# Patient Record
Sex: Female | Born: 1950 | Race: White | Hispanic: No | Marital: Married
Health system: Southern US, Community
[De-identification: ages and names within clinical notes are randomized; demographics above are authoritative.]

---

## 2003-11-08 ENCOUNTER — Other Ambulatory Visit: Admission: RE | Admit: 2003-11-08 | Discharge: 2003-11-08 | Payer: Self-pay | Admitting: Family Medicine

## 2005-04-07 ENCOUNTER — Other Ambulatory Visit: Admission: RE | Admit: 2005-04-07 | Discharge: 2005-04-07 | Payer: Self-pay | Admitting: Family Medicine

## 2008-03-08 ENCOUNTER — Ambulatory Visit: Payer: Self-pay | Admitting: Family Medicine

## 2008-03-08 DIAGNOSIS — M25539 Pain in unspecified wrist: Secondary | ICD-10-CM

## 2008-04-10 ENCOUNTER — Other Ambulatory Visit: Admission: RE | Admit: 2008-04-10 | Discharge: 2008-04-10 | Payer: Self-pay | Admitting: Family Medicine

## 2012-07-15 ENCOUNTER — Other Ambulatory Visit: Payer: Self-pay | Admitting: Family Medicine

## 2012-07-15 ENCOUNTER — Other Ambulatory Visit (HOSPITAL_COMMUNITY)
Admission: RE | Admit: 2012-07-15 | Discharge: 2012-07-15 | Disposition: A | Discharge status: Home / Self Care | Payer: 59 | Source: Ambulatory Visit | Attending: Family Medicine | Admitting: Family Medicine

## 2012-07-15 DIAGNOSIS — Z780 Asymptomatic menopausal state: Secondary | ICD-10-CM

## 2012-07-15 DIAGNOSIS — Z1151 Encounter for screening for human papillomavirus (HPV): Secondary | ICD-10-CM | POA: Insufficient documentation

## 2012-07-15 DIAGNOSIS — Z1231 Encounter for screening mammogram for malignant neoplasm of breast: Secondary | ICD-10-CM

## 2012-07-15 DIAGNOSIS — Z124 Encounter for screening for malignant neoplasm of cervix: Secondary | ICD-10-CM | POA: Insufficient documentation

## 2012-08-09 ENCOUNTER — Other Ambulatory Visit: Payer: Self-pay | Admitting: Family Medicine

## 2012-08-09 ENCOUNTER — Ambulatory Visit: Payer: Self-pay

## 2012-08-09 ENCOUNTER — Other Ambulatory Visit: Payer: Self-pay

## 2012-08-09 DIAGNOSIS — E2839 Other primary ovarian failure: Secondary | ICD-10-CM

## 2012-08-09 DIAGNOSIS — Z780 Asymptomatic menopausal state: Secondary | ICD-10-CM

## 2012-08-09 DIAGNOSIS — Z1231 Encounter for screening mammogram for malignant neoplasm of breast: Secondary | ICD-10-CM

## 2012-09-05 ENCOUNTER — Ambulatory Visit
Admission: RE | Admit: 2012-09-05 | Discharge: 2012-09-05 | Disposition: A | Discharge status: Home / Self Care | Payer: 59 | Source: Ambulatory Visit | Attending: Family Medicine | Admitting: Family Medicine

## 2012-09-05 DIAGNOSIS — E2839 Other primary ovarian failure: Secondary | ICD-10-CM

## 2012-09-05 DIAGNOSIS — Z780 Asymptomatic menopausal state: Secondary | ICD-10-CM

## 2014-05-07 ENCOUNTER — Telehealth: Payer: Self-pay | Admitting: Cardiology

## 2014-05-07 NOTE — Telephone Encounter (Signed)
Pt signed [facility] She faxed To Me She is asking For All Records, I have faxed to [facility] For her records to be processed  I have also Sent Nichole a e-mail Letting her Know about Release faxed. ROI Placed In [employer]  11.9.15/km

## 2015-07-04 DIAGNOSIS — H40013 Open angle with borderline findings, low risk, bilateral: Secondary | ICD-10-CM | POA: Diagnosis not present

## 2015-07-04 DIAGNOSIS — H5210 Myopia, unspecified eye: Secondary | ICD-10-CM | POA: Diagnosis not present

## 2015-07-04 DIAGNOSIS — H524 Presbyopia: Secondary | ICD-10-CM | POA: Diagnosis not present

## 2015-07-04 DIAGNOSIS — H5111 Convergence insufficiency: Secondary | ICD-10-CM | POA: Diagnosis not present

## 2015-07-17 DIAGNOSIS — M7121 Synovial cyst of popliteal space [Baker], right knee: Secondary | ICD-10-CM | POA: Diagnosis not present

## 2015-07-17 DIAGNOSIS — M25561 Pain in right knee: Secondary | ICD-10-CM | POA: Diagnosis not present

## 2015-07-22 DIAGNOSIS — M25561 Pain in right knee: Secondary | ICD-10-CM | POA: Diagnosis not present

## 2015-07-22 DIAGNOSIS — M7121 Synovial cyst of popliteal space [Baker], right knee: Secondary | ICD-10-CM | POA: Diagnosis not present

## 2015-07-25 DIAGNOSIS — M25561 Pain in right knee: Secondary | ICD-10-CM | POA: Diagnosis not present

## 2015-07-25 DIAGNOSIS — M7121 Synovial cyst of popliteal space [Baker], right knee: Secondary | ICD-10-CM | POA: Diagnosis not present

## 2015-07-29 DIAGNOSIS — M7121 Synovial cyst of popliteal space [Baker], right knee: Secondary | ICD-10-CM | POA: Diagnosis not present

## 2015-07-29 DIAGNOSIS — M25561 Pain in right knee: Secondary | ICD-10-CM | POA: Diagnosis not present

## 2015-08-01 DIAGNOSIS — M7121 Synovial cyst of popliteal space [Baker], right knee: Secondary | ICD-10-CM | POA: Diagnosis not present

## 2015-08-01 DIAGNOSIS — M25561 Pain in right knee: Secondary | ICD-10-CM | POA: Diagnosis not present

## 2015-08-05 DIAGNOSIS — M25561 Pain in right knee: Secondary | ICD-10-CM | POA: Diagnosis not present

## 2015-08-05 DIAGNOSIS — M7121 Synovial cyst of popliteal space [Baker], right knee: Secondary | ICD-10-CM | POA: Diagnosis not present

## 2015-08-08 DIAGNOSIS — M7121 Synovial cyst of popliteal space [Baker], right knee: Secondary | ICD-10-CM | POA: Diagnosis not present

## 2015-08-08 DIAGNOSIS — M25561 Pain in right knee: Secondary | ICD-10-CM | POA: Diagnosis not present

## 2015-08-12 DIAGNOSIS — M25561 Pain in right knee: Secondary | ICD-10-CM | POA: Diagnosis not present

## 2015-08-12 DIAGNOSIS — M7121 Synovial cyst of popliteal space [Baker], right knee: Secondary | ICD-10-CM | POA: Diagnosis not present

## 2015-08-15 DIAGNOSIS — M25561 Pain in right knee: Secondary | ICD-10-CM | POA: Diagnosis not present

## 2015-08-15 DIAGNOSIS — M7121 Synovial cyst of popliteal space [Baker], right knee: Secondary | ICD-10-CM | POA: Diagnosis not present

## 2015-12-18 ENCOUNTER — Other Ambulatory Visit: Payer: Self-pay | Admitting: Family Medicine

## 2015-12-18 ENCOUNTER — Other Ambulatory Visit (HOSPITAL_COMMUNITY)
Admission: RE | Admit: 2015-12-18 | Discharge: 2015-12-18 | Disposition: A | Discharge status: Home / Self Care | Payer: Commercial Managed Care - HMO | Source: Ambulatory Visit | Attending: Family Medicine | Admitting: Family Medicine

## 2015-12-18 DIAGNOSIS — Z7189 Other specified counseling: Secondary | ICD-10-CM | POA: Diagnosis not present

## 2015-12-18 DIAGNOSIS — I868 Varicose veins of other specified sites: Secondary | ICD-10-CM | POA: Diagnosis not present

## 2015-12-18 DIAGNOSIS — Z1389 Encounter for screening for other disorder: Secondary | ICD-10-CM | POA: Diagnosis not present

## 2015-12-18 DIAGNOSIS — R252 Cramp and spasm: Secondary | ICD-10-CM | POA: Diagnosis not present

## 2015-12-18 DIAGNOSIS — Z124 Encounter for screening for malignant neoplasm of cervix: Secondary | ICD-10-CM | POA: Diagnosis not present

## 2015-12-18 DIAGNOSIS — Z0000 Encounter for general adult medical examination without abnormal findings: Secondary | ICD-10-CM | POA: Diagnosis not present

## 2015-12-18 DIAGNOSIS — Z136 Encounter for screening for cardiovascular disorders: Secondary | ICD-10-CM | POA: Diagnosis not present

## 2015-12-18 DIAGNOSIS — Z1211 Encounter for screening for malignant neoplasm of colon: Secondary | ICD-10-CM | POA: Diagnosis not present

## 2015-12-18 DIAGNOSIS — Z131 Encounter for screening for diabetes mellitus: Secondary | ICD-10-CM | POA: Diagnosis not present

## 2015-12-18 DIAGNOSIS — Z01419 Encounter for gynecological examination (general) (routine) without abnormal findings: Secondary | ICD-10-CM | POA: Diagnosis not present

## 2015-12-18 DIAGNOSIS — Z23 Encounter for immunization: Secondary | ICD-10-CM | POA: Diagnosis not present

## 2015-12-18 DIAGNOSIS — E559 Vitamin D deficiency, unspecified: Secondary | ICD-10-CM | POA: Diagnosis not present

## 2015-12-20 LAB — CYTOLOGY - PAP

## 2015-12-30 ENCOUNTER — Other Ambulatory Visit: Payer: Self-pay | Admitting: Family Medicine

## 2015-12-30 DIAGNOSIS — Z1231 Encounter for screening mammogram for malignant neoplasm of breast: Secondary | ICD-10-CM

## 2016-03-05 ENCOUNTER — Ambulatory Visit
Admission: RE | Admit: 2016-03-05 | Discharge: 2016-03-05 | Disposition: A | Discharge status: Home / Self Care | Payer: Commercial Managed Care - HMO | Source: Ambulatory Visit | Attending: Family Medicine | Admitting: Family Medicine

## 2016-03-05 ENCOUNTER — Encounter: Payer: Self-pay | Admitting: Radiology

## 2016-03-05 DIAGNOSIS — Z1231 Encounter for screening mammogram for malignant neoplasm of breast: Secondary | ICD-10-CM

## 2016-04-07 DIAGNOSIS — D126 Benign neoplasm of colon, unspecified: Secondary | ICD-10-CM | POA: Diagnosis not present

## 2016-04-07 DIAGNOSIS — D125 Benign neoplasm of sigmoid colon: Secondary | ICD-10-CM | POA: Diagnosis not present

## 2016-04-07 DIAGNOSIS — D120 Benign neoplasm of cecum: Secondary | ICD-10-CM | POA: Diagnosis not present

## 2016-04-07 DIAGNOSIS — K644 Residual hemorrhoidal skin tags: Secondary | ICD-10-CM | POA: Diagnosis not present

## 2016-04-07 DIAGNOSIS — K648 Other hemorrhoids: Secondary | ICD-10-CM | POA: Diagnosis not present

## 2016-04-07 DIAGNOSIS — D123 Benign neoplasm of transverse colon: Secondary | ICD-10-CM | POA: Diagnosis not present

## 2016-04-07 DIAGNOSIS — K635 Polyp of colon: Secondary | ICD-10-CM | POA: Diagnosis not present

## 2016-04-07 DIAGNOSIS — D124 Benign neoplasm of descending colon: Secondary | ICD-10-CM | POA: Diagnosis not present

## 2016-04-07 DIAGNOSIS — Z1211 Encounter for screening for malignant neoplasm of colon: Secondary | ICD-10-CM | POA: Diagnosis not present

## 2016-04-14 DIAGNOSIS — Z1211 Encounter for screening for malignant neoplasm of colon: Secondary | ICD-10-CM | POA: Diagnosis not present

## 2016-04-14 DIAGNOSIS — D126 Benign neoplasm of colon, unspecified: Secondary | ICD-10-CM | POA: Diagnosis not present

## 2016-04-14 DIAGNOSIS — K635 Polyp of colon: Secondary | ICD-10-CM | POA: Diagnosis not present

## 2016-12-15 DIAGNOSIS — I8312 Varicose veins of left lower extremity with inflammation: Secondary | ICD-10-CM | POA: Diagnosis not present

## 2016-12-15 DIAGNOSIS — I83893 Varicose veins of bilateral lower extremities with other complications: Secondary | ICD-10-CM | POA: Diagnosis not present

## 2016-12-15 DIAGNOSIS — I8311 Varicose veins of right lower extremity with inflammation: Secondary | ICD-10-CM | POA: Diagnosis not present

## 2016-12-24 DIAGNOSIS — E559 Vitamin D deficiency, unspecified: Secondary | ICD-10-CM | POA: Diagnosis not present

## 2016-12-24 DIAGNOSIS — Z0000 Encounter for general adult medical examination without abnormal findings: Secondary | ICD-10-CM | POA: Diagnosis not present

## 2016-12-24 DIAGNOSIS — Z131 Encounter for screening for diabetes mellitus: Secondary | ICD-10-CM | POA: Diagnosis not present

## 2016-12-24 DIAGNOSIS — Z1389 Encounter for screening for other disorder: Secondary | ICD-10-CM | POA: Diagnosis not present

## 2016-12-24 DIAGNOSIS — J309 Allergic rhinitis, unspecified: Secondary | ICD-10-CM | POA: Diagnosis not present

## 2016-12-24 DIAGNOSIS — Z86010 Personal history of colonic polyps: Secondary | ICD-10-CM | POA: Diagnosis not present

## 2016-12-24 DIAGNOSIS — Z7189 Other specified counseling: Secondary | ICD-10-CM | POA: Diagnosis not present

## 2016-12-24 DIAGNOSIS — Z23 Encounter for immunization: Secondary | ICD-10-CM | POA: Diagnosis not present

## 2016-12-24 DIAGNOSIS — Z8659 Personal history of other mental and behavioral disorders: Secondary | ICD-10-CM | POA: Diagnosis not present

## 2016-12-24 DIAGNOSIS — Z1159 Encounter for screening for other viral diseases: Secondary | ICD-10-CM | POA: Diagnosis not present

## 2017-12-28 DIAGNOSIS — Z79899 Other long term (current) drug therapy: Secondary | ICD-10-CM | POA: Diagnosis not present

## 2017-12-28 DIAGNOSIS — Z131 Encounter for screening for diabetes mellitus: Secondary | ICD-10-CM | POA: Diagnosis not present

## 2017-12-28 DIAGNOSIS — R937 Abnormal findings on diagnostic imaging of other parts of musculoskeletal system: Secondary | ICD-10-CM | POA: Diagnosis not present

## 2017-12-28 DIAGNOSIS — D126 Benign neoplasm of colon, unspecified: Secondary | ICD-10-CM | POA: Diagnosis not present

## 2017-12-28 DIAGNOSIS — Z0000 Encounter for general adult medical examination without abnormal findings: Secondary | ICD-10-CM | POA: Diagnosis not present

## 2017-12-28 DIAGNOSIS — E559 Vitamin D deficiency, unspecified: Secondary | ICD-10-CM | POA: Diagnosis not present

## 2018-01-11 ENCOUNTER — Other Ambulatory Visit: Payer: Self-pay | Admitting: Family Medicine

## 2018-01-11 DIAGNOSIS — R5381 Other malaise: Secondary | ICD-10-CM

## 2018-01-14 ENCOUNTER — Other Ambulatory Visit: Payer: Self-pay | Admitting: Family Medicine

## 2018-01-14 DIAGNOSIS — E2839 Other primary ovarian failure: Secondary | ICD-10-CM

## 2018-04-15 DIAGNOSIS — K648 Other hemorrhoids: Secondary | ICD-10-CM | POA: Diagnosis not present

## 2018-04-15 DIAGNOSIS — Z86010 Personal history of colonic polyps: Secondary | ICD-10-CM | POA: Diagnosis not present

## 2018-04-15 DIAGNOSIS — D125 Benign neoplasm of sigmoid colon: Secondary | ICD-10-CM | POA: Diagnosis not present

## 2018-04-15 DIAGNOSIS — D123 Benign neoplasm of transverse colon: Secondary | ICD-10-CM | POA: Diagnosis not present

## 2018-04-15 DIAGNOSIS — D120 Benign neoplasm of cecum: Secondary | ICD-10-CM | POA: Diagnosis not present

## 2018-04-15 DIAGNOSIS — D124 Benign neoplasm of descending colon: Secondary | ICD-10-CM | POA: Diagnosis not present

## 2018-04-19 DIAGNOSIS — D125 Benign neoplasm of sigmoid colon: Secondary | ICD-10-CM | POA: Diagnosis not present

## 2018-04-19 DIAGNOSIS — D123 Benign neoplasm of transverse colon: Secondary | ICD-10-CM | POA: Diagnosis not present

## 2018-04-19 DIAGNOSIS — D124 Benign neoplasm of descending colon: Secondary | ICD-10-CM | POA: Diagnosis not present

## 2018-04-19 DIAGNOSIS — D120 Benign neoplasm of cecum: Secondary | ICD-10-CM | POA: Diagnosis not present

## 2019-01-23 DIAGNOSIS — D126 Benign neoplasm of colon, unspecified: Secondary | ICD-10-CM | POA: Diagnosis not present

## 2019-01-23 DIAGNOSIS — Z131 Encounter for screening for diabetes mellitus: Secondary | ICD-10-CM | POA: Diagnosis not present

## 2019-01-23 DIAGNOSIS — Z79899 Other long term (current) drug therapy: Secondary | ICD-10-CM | POA: Diagnosis not present

## 2019-01-23 DIAGNOSIS — R937 Abnormal findings on diagnostic imaging of other parts of musculoskeletal system: Secondary | ICD-10-CM | POA: Diagnosis not present

## 2019-01-23 DIAGNOSIS — Z0000 Encounter for general adult medical examination without abnormal findings: Secondary | ICD-10-CM | POA: Diagnosis not present

## 2019-01-23 DIAGNOSIS — E559 Vitamin D deficiency, unspecified: Secondary | ICD-10-CM | POA: Diagnosis not present

## 2019-01-24 DIAGNOSIS — M1990 Unspecified osteoarthritis, unspecified site: Secondary | ICD-10-CM | POA: Diagnosis not present

## 2019-01-24 DIAGNOSIS — Z86010 Personal history of colonic polyps: Secondary | ICD-10-CM | POA: Diagnosis not present

## 2019-01-24 DIAGNOSIS — Z131 Encounter for screening for diabetes mellitus: Secondary | ICD-10-CM | POA: Diagnosis not present

## 2019-01-24 DIAGNOSIS — E559 Vitamin D deficiency, unspecified: Secondary | ICD-10-CM | POA: Diagnosis not present

## 2019-01-24 DIAGNOSIS — Z8659 Personal history of other mental and behavioral disorders: Secondary | ICD-10-CM | POA: Diagnosis not present

## 2019-01-24 DIAGNOSIS — J309 Allergic rhinitis, unspecified: Secondary | ICD-10-CM | POA: Diagnosis not present

## 2019-01-24 DIAGNOSIS — Z13220 Encounter for screening for lipoid disorders: Secondary | ICD-10-CM | POA: Diagnosis not present

## 2019-01-26 DIAGNOSIS — Z0000 Encounter for general adult medical examination without abnormal findings: Secondary | ICD-10-CM | POA: Diagnosis not present

## 2019-03-20 DIAGNOSIS — N950 Postmenopausal bleeding: Secondary | ICD-10-CM | POA: Diagnosis not present

## 2019-03-23 ENCOUNTER — Other Ambulatory Visit: Payer: Self-pay | Admitting: Family Medicine

## 2019-03-23 DIAGNOSIS — N950 Postmenopausal bleeding: Secondary | ICD-10-CM

## 2019-04-03 ENCOUNTER — Ambulatory Visit
Admission: RE | Admit: 2019-04-03 | Discharge: 2019-04-03 | Disposition: A | Discharge status: Home / Self Care | Payer: Commercial Managed Care - HMO | Source: Ambulatory Visit | Attending: Family Medicine | Admitting: Family Medicine

## 2019-04-03 DIAGNOSIS — N950 Postmenopausal bleeding: Secondary | ICD-10-CM | POA: Diagnosis not present

## 2019-04-03 DIAGNOSIS — N858 Other specified noninflammatory disorders of uterus: Secondary | ICD-10-CM | POA: Diagnosis not present

## 2019-05-30 DIAGNOSIS — E559 Vitamin D deficiency, unspecified: Secondary | ICD-10-CM | POA: Diagnosis not present

## 2019-05-30 DIAGNOSIS — N950 Postmenopausal bleeding: Secondary | ICD-10-CM | POA: Diagnosis not present

## 2019-05-30 DIAGNOSIS — G5701 Lesion of sciatic nerve, right lower limb: Secondary | ICD-10-CM | POA: Diagnosis not present

## 2019-05-30 DIAGNOSIS — Z23 Encounter for immunization: Secondary | ICD-10-CM | POA: Diagnosis not present

## 2019-09-21 DIAGNOSIS — H5203 Hypermetropia, bilateral: Secondary | ICD-10-CM | POA: Diagnosis not present

## 2019-10-17 ENCOUNTER — Other Ambulatory Visit: Payer: Self-pay | Admitting: Family Medicine

## 2019-10-17 DIAGNOSIS — Z1231 Encounter for screening mammogram for malignant neoplasm of breast: Secondary | ICD-10-CM

## 2020-01-05 IMAGING — US US PELVIS COMPLETE WITH TRANSVAGINAL
1 series · 13 of 25 positions shown · non-contrast
Comparison: None

CLINICAL DATA: Postmenopausal bleeding for 11 days

EXAM:
TRANSABDOMINAL AND TRANSVAGINAL ULTRASOUND OF PELVIS
TECHNIQUE: Both transabdominal and transvaginal ultrasound examinations of the
pelvis were performed. Transabdominal technique was performed for
global imaging of the pelvis including uterus, ovaries, adnexal
regions, and pelvic cul-de-sac. It was necessary to proceed with
endovaginal exam following the transabdominal exam to visualize the
endometrium and ovaries.

[Series 1: us pelvis complete with transvaginal · 0.28mm/px · 13 of 39 slices shown]
[im 1/39]
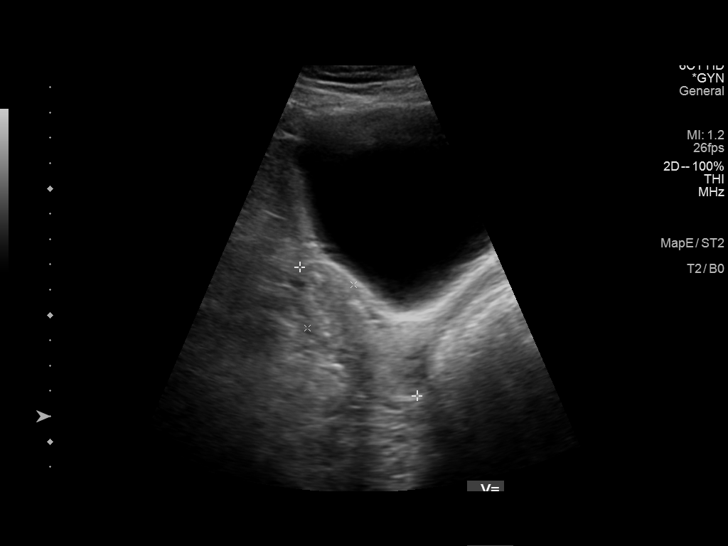
[im 4/39]
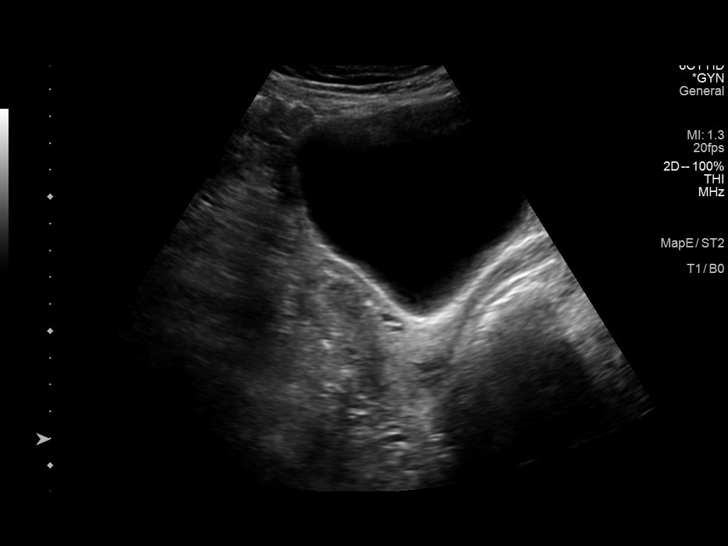
[im 7/39]
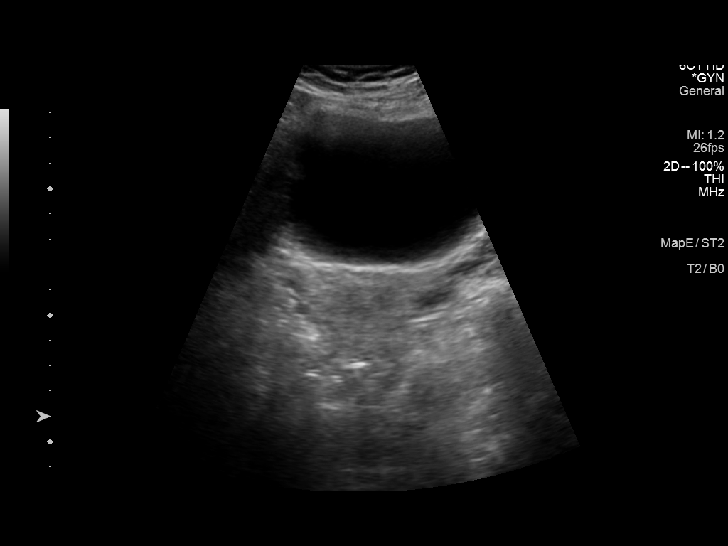
[im 10/39]
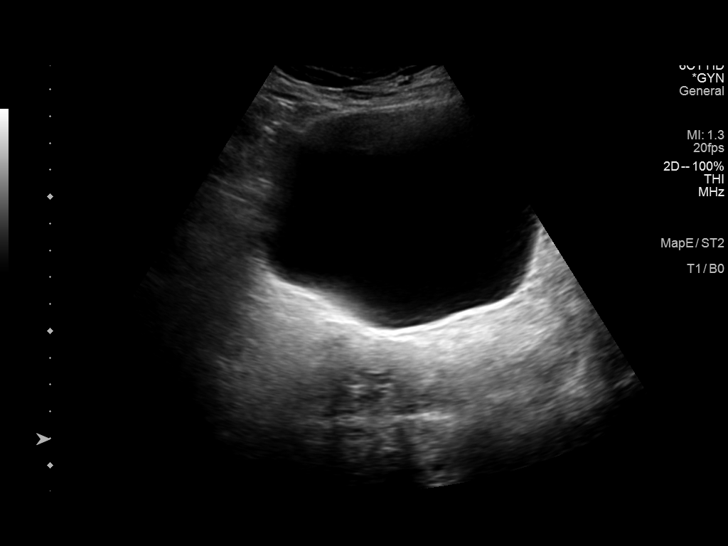
[im 13/39]
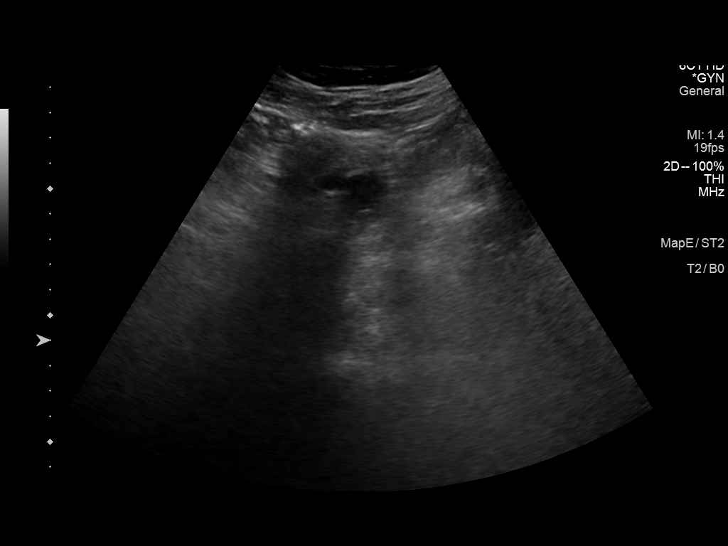
[im 16/39]
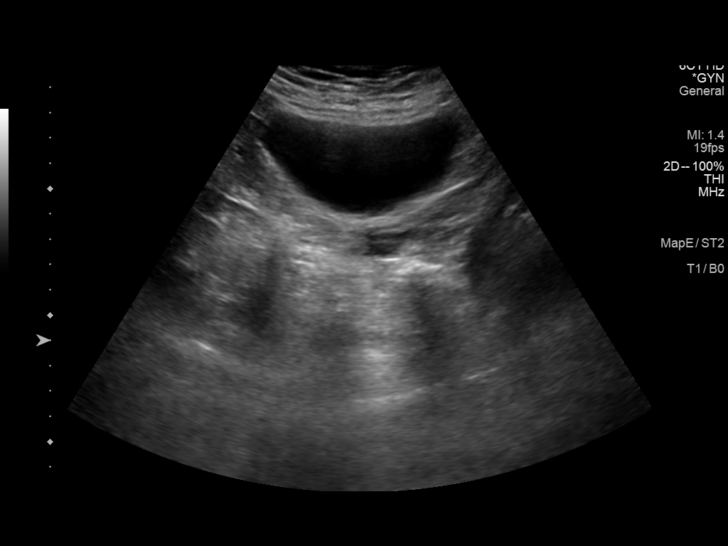
[im 20/39]
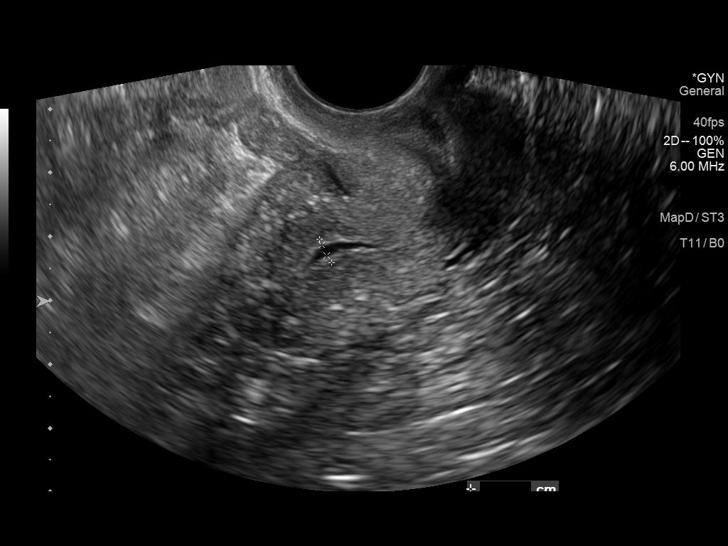
[im 23/39]
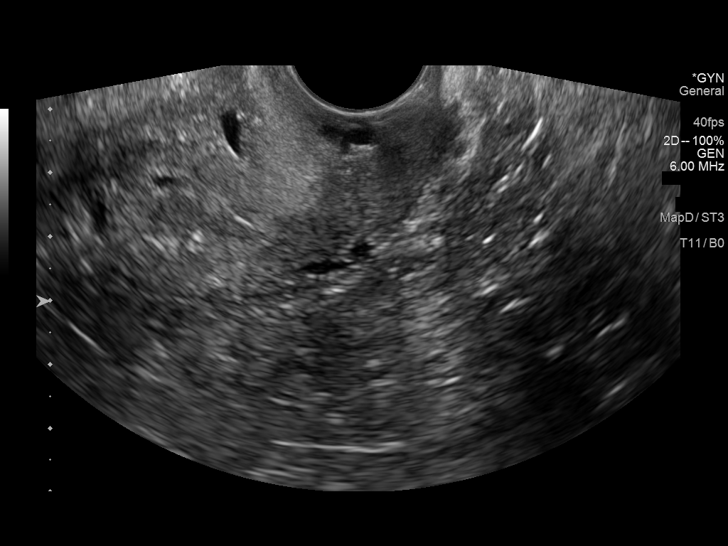
[im 26/39]
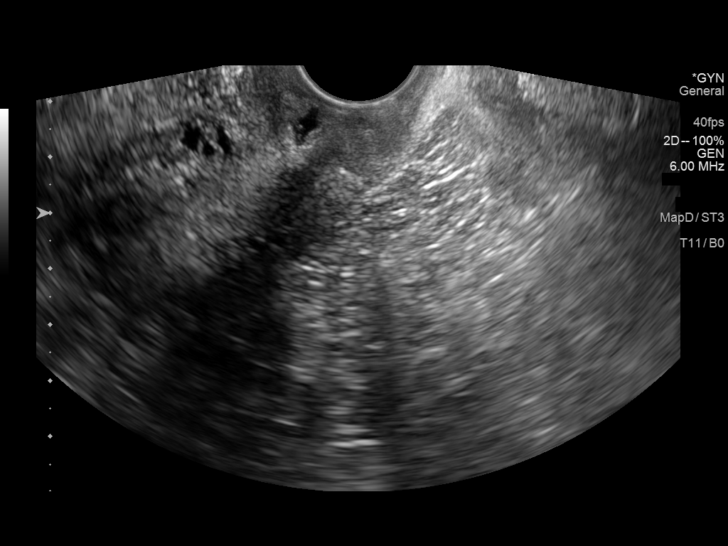
[im 29/39]
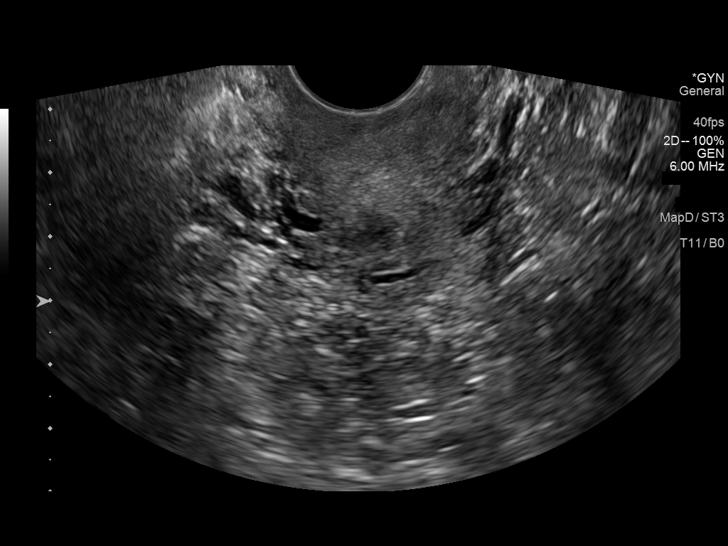
[im 32/39]
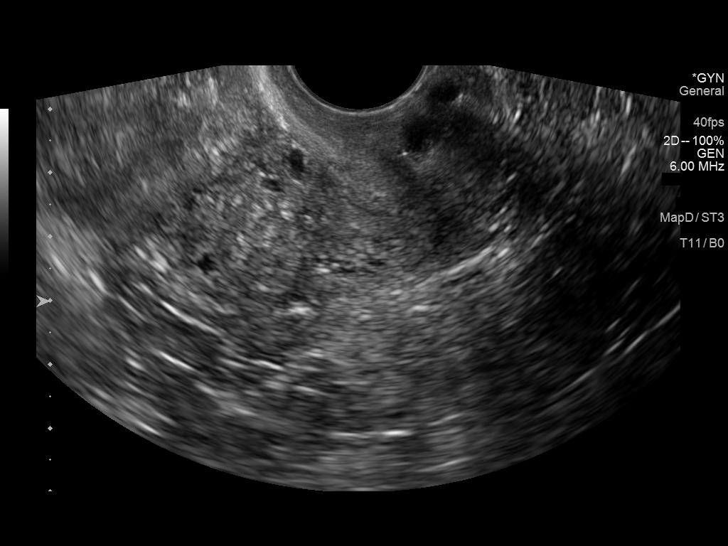
[im 35/39]
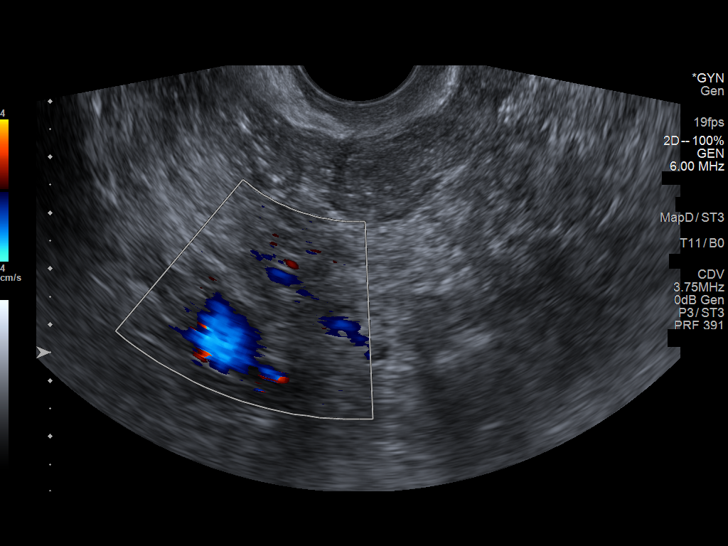
[im 39/39]
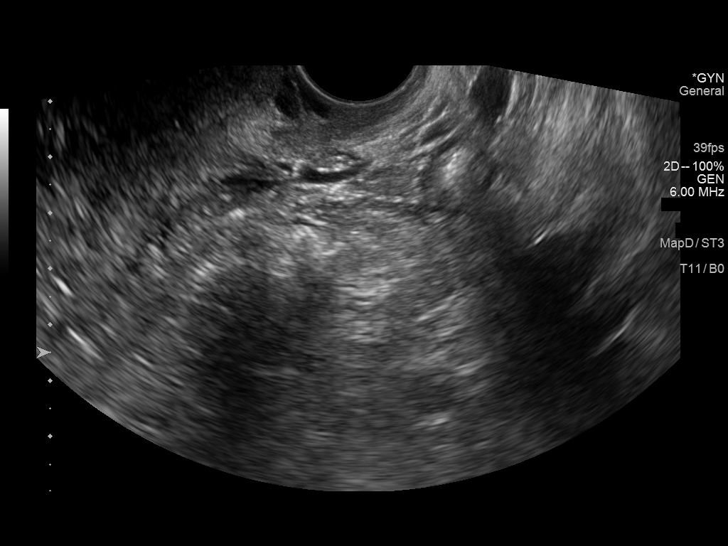

[13 of 25 positions shown; findings below may reference images not displayed]

FINDINGS: Uterus

Measurements: 6.9 x 2.5 x 4.6 cm = volume: 42 mL. Anteverted. Normal
morphology without mass small nabothian cysts at cervix.

Endometrium

Thickness: 3 mm. Small amount of nonspecific endometrial fluid. No
definite mass.

Right ovary

Not visualized on either transabdominal or endovaginal imaging,
question atrophic versus obscured by bowel

Left ovary

Not visualized on either transabdominal or endovaginal imaging,
question atrophic versus obscured by bowel

Other findings

No free pelvic fluid.  No adnexal masses.
IMPRESSION: Atrophic uterus.

Normal 3 mm thick endometrial complex with small amount of
nonspecific endometrial fluid.

In the setting of post-menopausal bleeding, this is consistent with
a benign etiology such as endometrial atrophy. If bleeding remains
unresponsive to hormonal or medical therapy, sonohysterogram should
be considered for focal lesion work-up. (Ref: Radiological
Reasoning: Algorithmic Workup of Abnormal Vaginal Bleeding with
Endovaginal Sonography and Sonohysterography. AJR [id number]; [id number])

## 2020-01-26 DIAGNOSIS — M79671 Pain in right foot: Secondary | ICD-10-CM | POA: Diagnosis not present

## 2020-02-09 DIAGNOSIS — M1990 Unspecified osteoarthritis, unspecified site: Secondary | ICD-10-CM | POA: Diagnosis not present

## 2020-02-09 DIAGNOSIS — Z86010 Personal history of colonic polyps: Secondary | ICD-10-CM | POA: Diagnosis not present

## 2020-02-09 DIAGNOSIS — J309 Allergic rhinitis, unspecified: Secondary | ICD-10-CM | POA: Diagnosis not present

## 2020-02-09 DIAGNOSIS — E559 Vitamin D deficiency, unspecified: Secondary | ICD-10-CM | POA: Diagnosis not present

## 2020-02-09 DIAGNOSIS — Z131 Encounter for screening for diabetes mellitus: Secondary | ICD-10-CM | POA: Diagnosis not present

## 2020-02-09 DIAGNOSIS — Z136 Encounter for screening for cardiovascular disorders: Secondary | ICD-10-CM | POA: Diagnosis not present

## 2020-02-09 DIAGNOSIS — Z8659 Personal history of other mental and behavioral disorders: Secondary | ICD-10-CM | POA: Diagnosis not present

## 2020-02-16 DIAGNOSIS — M1990 Unspecified osteoarthritis, unspecified site: Secondary | ICD-10-CM | POA: Diagnosis not present

## 2020-02-16 DIAGNOSIS — E2839 Other primary ovarian failure: Secondary | ICD-10-CM | POA: Diagnosis not present

## 2020-02-16 DIAGNOSIS — Z1389 Encounter for screening for other disorder: Secondary | ICD-10-CM | POA: Diagnosis not present

## 2020-02-16 DIAGNOSIS — Z01419 Encounter for gynecological examination (general) (routine) without abnormal findings: Secondary | ICD-10-CM | POA: Diagnosis not present

## 2020-02-16 DIAGNOSIS — E7800 Pure hypercholesterolemia, unspecified: Secondary | ICD-10-CM | POA: Diagnosis not present

## 2020-02-16 DIAGNOSIS — Z131 Encounter for screening for diabetes mellitus: Secondary | ICD-10-CM | POA: Diagnosis not present

## 2020-02-16 DIAGNOSIS — E559 Vitamin D deficiency, unspecified: Secondary | ICD-10-CM | POA: Diagnosis not present

## 2020-02-16 DIAGNOSIS — J309 Allergic rhinitis, unspecified: Secondary | ICD-10-CM | POA: Diagnosis not present

## 2020-02-16 DIAGNOSIS — Z0000 Encounter for general adult medical examination without abnormal findings: Secondary | ICD-10-CM | POA: Diagnosis not present

## 2020-02-22 ENCOUNTER — Other Ambulatory Visit: Payer: Self-pay | Admitting: Family Medicine

## 2020-02-22 DIAGNOSIS — E2839 Other primary ovarian failure: Secondary | ICD-10-CM

## 2020-03-12 DIAGNOSIS — H40013 Open angle with borderline findings, low risk, bilateral: Secondary | ICD-10-CM | POA: Diagnosis not present

## 2020-05-31 ENCOUNTER — Other Ambulatory Visit: Payer: Self-pay

## 2020-05-31 ENCOUNTER — Ambulatory Visit
Admission: RE | Admit: 2020-05-31 | Discharge: 2020-05-31 | Disposition: A | Discharge status: Home / Self Care | Payer: Medicare HMO | Source: Ambulatory Visit | Attending: Family Medicine | Admitting: Family Medicine

## 2020-05-31 DIAGNOSIS — Z780 Asymptomatic menopausal state: Secondary | ICD-10-CM | POA: Diagnosis not present

## 2020-05-31 DIAGNOSIS — E2839 Other primary ovarian failure: Secondary | ICD-10-CM

## 2020-09-24 DIAGNOSIS — H524 Presbyopia: Secondary | ICD-10-CM | POA: Diagnosis not present

## 2021-02-18 DIAGNOSIS — M1990 Unspecified osteoarthritis, unspecified site: Secondary | ICD-10-CM | POA: Diagnosis not present

## 2021-02-18 DIAGNOSIS — E7800 Pure hypercholesterolemia, unspecified: Secondary | ICD-10-CM | POA: Diagnosis not present

## 2021-02-18 DIAGNOSIS — R635 Abnormal weight gain: Secondary | ICD-10-CM | POA: Diagnosis not present

## 2021-02-18 DIAGNOSIS — M8580 Other specified disorders of bone density and structure, unspecified site: Secondary | ICD-10-CM | POA: Diagnosis not present

## 2021-02-18 DIAGNOSIS — E559 Vitamin D deficiency, unspecified: Secondary | ICD-10-CM | POA: Diagnosis not present

## 2021-02-18 DIAGNOSIS — M25551 Pain in right hip: Secondary | ICD-10-CM | POA: Diagnosis not present

## 2021-02-18 DIAGNOSIS — Z0000 Encounter for general adult medical examination without abnormal findings: Secondary | ICD-10-CM | POA: Diagnosis not present

## 2021-02-18 DIAGNOSIS — G5701 Lesion of sciatic nerve, right lower limb: Secondary | ICD-10-CM | POA: Diagnosis not present

## 2021-02-18 DIAGNOSIS — Z131 Encounter for screening for diabetes mellitus: Secondary | ICD-10-CM | POA: Diagnosis not present

## 2021-02-19 DIAGNOSIS — M25551 Pain in right hip: Secondary | ICD-10-CM | POA: Diagnosis not present

## 2021-02-19 DIAGNOSIS — G5701 Lesion of sciatic nerve, right lower limb: Secondary | ICD-10-CM | POA: Diagnosis not present

## 2022-02-24 ENCOUNTER — Other Ambulatory Visit: Payer: Self-pay | Admitting: Family Medicine

## 2022-02-24 DIAGNOSIS — E559 Vitamin D deficiency, unspecified: Secondary | ICD-10-CM | POA: Diagnosis not present

## 2022-02-24 DIAGNOSIS — Z0000 Encounter for general adult medical examination without abnormal findings: Secondary | ICD-10-CM | POA: Diagnosis not present

## 2022-02-24 DIAGNOSIS — M722 Plantar fascial fibromatosis: Secondary | ICD-10-CM | POA: Diagnosis not present

## 2022-02-24 DIAGNOSIS — M1990 Unspecified osteoarthritis, unspecified site: Secondary | ICD-10-CM | POA: Diagnosis not present

## 2022-02-24 DIAGNOSIS — M85851 Other specified disorders of bone density and structure, right thigh: Secondary | ICD-10-CM

## 2022-02-24 DIAGNOSIS — Z131 Encounter for screening for diabetes mellitus: Secondary | ICD-10-CM | POA: Diagnosis not present

## 2022-02-24 DIAGNOSIS — E7800 Pure hypercholesterolemia, unspecified: Secondary | ICD-10-CM | POA: Diagnosis not present

## 2022-02-27 ENCOUNTER — Other Ambulatory Visit (HOSPITAL_BASED_OUTPATIENT_CLINIC_OR_DEPARTMENT_OTHER): Payer: Self-pay | Admitting: Family Medicine

## 2022-02-27 DIAGNOSIS — E7800 Pure hypercholesterolemia, unspecified: Secondary | ICD-10-CM

## 2022-03-13 ENCOUNTER — Other Ambulatory Visit (HOSPITAL_BASED_OUTPATIENT_CLINIC_OR_DEPARTMENT_OTHER): Payer: Medicare HMO

## 2022-03-18 ENCOUNTER — Ambulatory Visit (HOSPITAL_BASED_OUTPATIENT_CLINIC_OR_DEPARTMENT_OTHER)
Admission: RE | Admit: 2022-03-18 | Discharge: 2022-03-18 | Disposition: A | Discharge status: Home / Self Care | Payer: Medicare Other | Source: Ambulatory Visit | Attending: Family Medicine | Admitting: Family Medicine

## 2022-03-18 DIAGNOSIS — E7800 Pure hypercholesterolemia, unspecified: Secondary | ICD-10-CM | POA: Insufficient documentation

## 2022-06-04 DIAGNOSIS — R194 Change in bowel habit: Secondary | ICD-10-CM | POA: Diagnosis not present

## 2022-06-04 DIAGNOSIS — Z86010 Personal history of colonic polyps: Secondary | ICD-10-CM | POA: Diagnosis not present

## 2022-06-16 DIAGNOSIS — Z86010 Personal history of colonic polyps: Secondary | ICD-10-CM | POA: Diagnosis not present

## 2022-06-16 DIAGNOSIS — K635 Polyp of colon: Secondary | ICD-10-CM | POA: Diagnosis not present

## 2022-06-16 DIAGNOSIS — D123 Benign neoplasm of transverse colon: Secondary | ICD-10-CM | POA: Diagnosis not present

## 2022-06-16 DIAGNOSIS — K621 Rectal polyp: Secondary | ICD-10-CM | POA: Diagnosis not present

## 2022-06-16 DIAGNOSIS — Z9889 Other specified postprocedural states: Secondary | ICD-10-CM | POA: Diagnosis not present

## 2022-06-16 DIAGNOSIS — K649 Unspecified hemorrhoids: Secondary | ICD-10-CM | POA: Diagnosis not present

## 2022-06-16 DIAGNOSIS — Z09 Encounter for follow-up examination after completed treatment for conditions other than malignant neoplasm: Secondary | ICD-10-CM | POA: Diagnosis not present

## 2022-06-18 DIAGNOSIS — K635 Polyp of colon: Secondary | ICD-10-CM | POA: Diagnosis not present

## 2022-06-18 DIAGNOSIS — K621 Rectal polyp: Secondary | ICD-10-CM | POA: Diagnosis not present

## 2022-06-18 DIAGNOSIS — D123 Benign neoplasm of transverse colon: Secondary | ICD-10-CM | POA: Diagnosis not present

## 2022-08-18 DIAGNOSIS — Z85828 Personal history of other malignant neoplasm of skin: Secondary | ICD-10-CM | POA: Diagnosis not present

## 2022-08-18 DIAGNOSIS — D225 Melanocytic nevi of trunk: Secondary | ICD-10-CM | POA: Diagnosis not present

## 2022-08-18 DIAGNOSIS — D2372 Other benign neoplasm of skin of left lower limb, including hip: Secondary | ICD-10-CM | POA: Diagnosis not present

## 2022-08-18 DIAGNOSIS — L821 Other seborrheic keratosis: Secondary | ICD-10-CM | POA: Diagnosis not present

## 2022-08-25 ENCOUNTER — Other Ambulatory Visit: Payer: Self-pay | Admitting: Family Medicine

## 2022-08-25 ENCOUNTER — Ambulatory Visit
Admission: RE | Admit: 2022-08-25 | Discharge: 2022-08-25 | Disposition: A | Discharge status: Home / Self Care | Payer: Medicare Other | Source: Ambulatory Visit | Attending: Family Medicine | Admitting: Family Medicine

## 2022-08-25 DIAGNOSIS — M85851 Other specified disorders of bone density and structure, right thigh: Secondary | ICD-10-CM

## 2022-08-25 DIAGNOSIS — Z780 Asymptomatic menopausal state: Secondary | ICD-10-CM | POA: Diagnosis not present

## 2022-10-14 DIAGNOSIS — H524 Presbyopia: Secondary | ICD-10-CM | POA: Diagnosis not present

## 2022-11-05 DIAGNOSIS — K080 Exfoliation of teeth due to systemic causes: Secondary | ICD-10-CM | POA: Diagnosis not present

## 2023-02-24 ENCOUNTER — Other Ambulatory Visit: Payer: Self-pay | Admitting: Family Medicine

## 2023-02-24 DIAGNOSIS — I77810 Thoracic aortic ectasia: Secondary | ICD-10-CM

## 2023-03-09 DIAGNOSIS — E7800 Pure hypercholesterolemia, unspecified: Secondary | ICD-10-CM | POA: Diagnosis not present

## 2023-03-09 DIAGNOSIS — M8580 Other specified disorders of bone density and structure, unspecified site: Secondary | ICD-10-CM | POA: Diagnosis not present

## 2023-03-09 DIAGNOSIS — Z1239 Encounter for other screening for malignant neoplasm of breast: Secondary | ICD-10-CM | POA: Diagnosis not present

## 2023-03-09 DIAGNOSIS — Z0000 Encounter for general adult medical examination without abnormal findings: Secondary | ICD-10-CM | POA: Diagnosis not present

## 2023-03-09 DIAGNOSIS — E612 Magnesium deficiency: Secondary | ICD-10-CM | POA: Diagnosis not present

## 2023-03-09 DIAGNOSIS — E559 Vitamin D deficiency, unspecified: Secondary | ICD-10-CM | POA: Diagnosis not present

## 2023-03-09 DIAGNOSIS — I872 Venous insufficiency (chronic) (peripheral): Secondary | ICD-10-CM | POA: Diagnosis not present

## 2023-03-10 ENCOUNTER — Ambulatory Visit
Admission: RE | Admit: 2023-03-10 | Discharge: 2023-03-10 | Disposition: A | Discharge status: Home / Self Care | Payer: Medicare Other | Source: Ambulatory Visit | Attending: Family Medicine | Admitting: Family Medicine

## 2023-03-10 DIAGNOSIS — R918 Other nonspecific abnormal finding of lung field: Secondary | ICD-10-CM | POA: Diagnosis not present

## 2023-03-10 DIAGNOSIS — I700 Atherosclerosis of aorta: Secondary | ICD-10-CM | POA: Diagnosis not present

## 2023-03-10 DIAGNOSIS — I77810 Thoracic aortic ectasia: Secondary | ICD-10-CM

## 2023-03-10 DIAGNOSIS — I7121 Aneurysm of the ascending aorta, without rupture: Secondary | ICD-10-CM | POA: Diagnosis not present

## 2023-07-02 DIAGNOSIS — K080 Exfoliation of teeth due to systemic causes: Secondary | ICD-10-CM | POA: Diagnosis not present

## 2023-07-05 ENCOUNTER — Other Ambulatory Visit: Payer: Self-pay | Admitting: Family Medicine

## 2023-07-05 DIAGNOSIS — R918 Other nonspecific abnormal finding of lung field: Secondary | ICD-10-CM

## 2023-07-12 ENCOUNTER — Ambulatory Visit
Admission: RE | Admit: 2023-07-12 | Discharge: 2023-07-12 | Disposition: A | Discharge status: Home / Self Care | Payer: Medicare Other | Source: Ambulatory Visit | Attending: Family Medicine | Admitting: Family Medicine

## 2023-07-12 DIAGNOSIS — I7121 Aneurysm of the ascending aorta, without rupture: Secondary | ICD-10-CM | POA: Diagnosis not present

## 2023-07-12 DIAGNOSIS — R918 Other nonspecific abnormal finding of lung field: Secondary | ICD-10-CM

## 2023-09-13 DIAGNOSIS — I712 Thoracic aortic aneurysm, without rupture, unspecified: Secondary | ICD-10-CM | POA: Diagnosis not present

## 2023-09-13 DIAGNOSIS — Z6827 Body mass index (BMI) 27.0-27.9, adult: Secondary | ICD-10-CM | POA: Diagnosis not present

## 2023-09-13 DIAGNOSIS — M8580 Other specified disorders of bone density and structure, unspecified site: Secondary | ICD-10-CM | POA: Diagnosis not present

## 2023-09-13 DIAGNOSIS — E7800 Pure hypercholesterolemia, unspecified: Secondary | ICD-10-CM | POA: Diagnosis not present

## 2023-10-14 DIAGNOSIS — H524 Presbyopia: Secondary | ICD-10-CM | POA: Diagnosis not present

## 2023-10-26 DIAGNOSIS — E7800 Pure hypercholesterolemia, unspecified: Secondary | ICD-10-CM | POA: Diagnosis not present

## 2023-12-13 DIAGNOSIS — G4700 Insomnia, unspecified: Secondary | ICD-10-CM | POA: Diagnosis not present

## 2024-01-04 DIAGNOSIS — K080 Exfoliation of teeth due to systemic causes: Secondary | ICD-10-CM | POA: Diagnosis not present

## 2024-01-05 DIAGNOSIS — H6121 Impacted cerumen, right ear: Secondary | ICD-10-CM | POA: Diagnosis not present

## 2024-01-05 DIAGNOSIS — M62838 Other muscle spasm: Secondary | ICD-10-CM | POA: Diagnosis not present

## 2024-01-07 DIAGNOSIS — H6121 Impacted cerumen, right ear: Secondary | ICD-10-CM | POA: Diagnosis not present

## 2024-01-27 DIAGNOSIS — M1990 Unspecified osteoarthritis, unspecified site: Secondary | ICD-10-CM | POA: Diagnosis not present

## 2024-01-27 DIAGNOSIS — F322 Major depressive disorder, single episode, severe without psychotic features: Secondary | ICD-10-CM | POA: Diagnosis not present

## 2024-01-27 DIAGNOSIS — E7800 Pure hypercholesterolemia, unspecified: Secondary | ICD-10-CM | POA: Diagnosis not present

## 2024-02-27 DIAGNOSIS — F322 Major depressive disorder, single episode, severe without psychotic features: Secondary | ICD-10-CM | POA: Diagnosis not present

## 2024-02-27 DIAGNOSIS — E7800 Pure hypercholesterolemia, unspecified: Secondary | ICD-10-CM | POA: Diagnosis not present

## 2024-02-27 DIAGNOSIS — M1990 Unspecified osteoarthritis, unspecified site: Secondary | ICD-10-CM | POA: Diagnosis not present

## 2024-03-09 DIAGNOSIS — Z01419 Encounter for gynecological examination (general) (routine) without abnormal findings: Secondary | ICD-10-CM | POA: Diagnosis not present

## 2024-03-09 DIAGNOSIS — M85851 Other specified disorders of bone density and structure, right thigh: Secondary | ICD-10-CM | POA: Diagnosis not present

## 2024-03-09 DIAGNOSIS — Z6828 Body mass index (BMI) 28.0-28.9, adult: Secondary | ICD-10-CM | POA: Diagnosis not present

## 2024-03-09 DIAGNOSIS — Z0000 Encounter for general adult medical examination without abnormal findings: Secondary | ICD-10-CM | POA: Diagnosis not present

## 2024-03-09 DIAGNOSIS — E7800 Pure hypercholesterolemia, unspecified: Secondary | ICD-10-CM | POA: Diagnosis not present

## 2024-03-09 DIAGNOSIS — I712 Thoracic aortic aneurysm, without rupture, unspecified: Secondary | ICD-10-CM | POA: Diagnosis not present

## 2024-03-09 DIAGNOSIS — G479 Sleep disorder, unspecified: Secondary | ICD-10-CM | POA: Diagnosis not present

## 2024-03-16 DIAGNOSIS — Z23 Encounter for immunization: Secondary | ICD-10-CM | POA: Diagnosis not present

## 2024-03-28 DIAGNOSIS — F322 Major depressive disorder, single episode, severe without psychotic features: Secondary | ICD-10-CM | POA: Diagnosis not present

## 2024-03-28 DIAGNOSIS — E7800 Pure hypercholesterolemia, unspecified: Secondary | ICD-10-CM | POA: Diagnosis not present

## 2024-03-28 DIAGNOSIS — M1990 Unspecified osteoarthritis, unspecified site: Secondary | ICD-10-CM | POA: Diagnosis not present

## 2024-03-29 DIAGNOSIS — G4700 Insomnia, unspecified: Secondary | ICD-10-CM | POA: Diagnosis not present

## 2024-03-29 DIAGNOSIS — G4730 Sleep apnea, unspecified: Secondary | ICD-10-CM | POA: Diagnosis not present

## 2024-04-25 DIAGNOSIS — G4733 Obstructive sleep apnea (adult) (pediatric): Secondary | ICD-10-CM | POA: Diagnosis not present

## 2024-04-28 DIAGNOSIS — E7800 Pure hypercholesterolemia, unspecified: Secondary | ICD-10-CM | POA: Diagnosis not present

## 2024-04-28 DIAGNOSIS — F322 Major depressive disorder, single episode, severe without psychotic features: Secondary | ICD-10-CM | POA: Diagnosis not present

## 2024-04-28 DIAGNOSIS — M1990 Unspecified osteoarthritis, unspecified site: Secondary | ICD-10-CM | POA: Diagnosis not present

## 2024-05-02 DIAGNOSIS — H40023 Open angle with borderline findings, high risk, bilateral: Secondary | ICD-10-CM | POA: Diagnosis not present

## 2024-05-02 DIAGNOSIS — G4733 Obstructive sleep apnea (adult) (pediatric): Secondary | ICD-10-CM | POA: Diagnosis not present

## 2024-05-09 DIAGNOSIS — G4733 Obstructive sleep apnea (adult) (pediatric): Secondary | ICD-10-CM | POA: Diagnosis not present

## 2024-05-28 DIAGNOSIS — F322 Major depressive disorder, single episode, severe without psychotic features: Secondary | ICD-10-CM | POA: Diagnosis not present

## 2024-05-28 DIAGNOSIS — M1990 Unspecified osteoarthritis, unspecified site: Secondary | ICD-10-CM | POA: Diagnosis not present

## 2024-05-28 DIAGNOSIS — E7800 Pure hypercholesterolemia, unspecified: Secondary | ICD-10-CM | POA: Diagnosis not present

## 2024-06-09 DIAGNOSIS — M79605 Pain in left leg: Secondary | ICD-10-CM | POA: Diagnosis not present

## 2024-06-09 DIAGNOSIS — M6788 Other specified disorders of synovium and tendon, other site: Secondary | ICD-10-CM | POA: Diagnosis not present

## 2024-06-09 DIAGNOSIS — S8012XA Contusion of left lower leg, initial encounter: Secondary | ICD-10-CM | POA: Diagnosis not present

## 2024-06-09 DIAGNOSIS — M76812 Anterior tibial syndrome, left leg: Secondary | ICD-10-CM | POA: Diagnosis not present

## 2024-06-10 DIAGNOSIS — J019 Acute sinusitis, unspecified: Secondary | ICD-10-CM | POA: Diagnosis not present

## 2024-06-15 ENCOUNTER — Other Ambulatory Visit (HOSPITAL_COMMUNITY): Payer: Self-pay

## 2024-06-15 DIAGNOSIS — M79605 Pain in left leg: Secondary | ICD-10-CM

## 2024-06-16 ENCOUNTER — Ambulatory Visit (HOSPITAL_COMMUNITY)
Admission: RE | Admit: 2024-06-16 | Discharge: 2024-06-16 | Disposition: A | Discharge status: Home / Self Care | Source: Ambulatory Visit | Attending: Surgery | Admitting: Surgery

## 2024-06-16 DIAGNOSIS — M79605 Pain in left leg: Secondary | ICD-10-CM | POA: Diagnosis not present

## 2024-07-13 ENCOUNTER — Other Ambulatory Visit: Payer: Self-pay | Admitting: Family Medicine

## 2024-07-13 DIAGNOSIS — I712 Thoracic aortic aneurysm, without rupture, unspecified: Secondary | ICD-10-CM

## 2024-08-02 ENCOUNTER — Inpatient Hospital Stay
Admission: RE | Admit: 2024-08-02 | Discharge: 2024-08-02 | Disposition: A | Source: Ambulatory Visit | Attending: Family Medicine | Admitting: Family Medicine

## 2024-08-02 DIAGNOSIS — I712 Thoracic aortic aneurysm, without rupture, unspecified: Secondary | ICD-10-CM

## 2024-08-02 MED ORDER — IOPAMIDOL (ISOVUE-370) INJECTION 76%
75.0000 mL | Freq: Once | INTRAVENOUS | Status: AC | PRN
  Administered 2024-08-02: 75 mL via INTRAVENOUS
